# Patient Record
Sex: Female | Born: 1970 | Race: White | Hispanic: No | Marital: Married | Smoking: Never smoker
Health system: Southern US, Community
[De-identification: ages and names within clinical notes are randomized; demographics above are authoritative.]

## PROBLEM LIST (undated history)

## (undated) DIAGNOSIS — E119 Type 2 diabetes mellitus without complications: Secondary | ICD-10-CM

## (undated) DIAGNOSIS — T7840XA Allergy, unspecified, initial encounter: Secondary | ICD-10-CM

## (undated) DIAGNOSIS — E1169 Type 2 diabetes mellitus with other specified complication: Secondary | ICD-10-CM

## (undated) DIAGNOSIS — I10 Essential (primary) hypertension: Secondary | ICD-10-CM

## (undated) DIAGNOSIS — E66811 Obesity, class 1: Secondary | ICD-10-CM

## (undated) DIAGNOSIS — E669 Obesity, unspecified: Secondary | ICD-10-CM

## (undated) DIAGNOSIS — K5792 Diverticulitis of intestine, part unspecified, without perforation or abscess without bleeding: Secondary | ICD-10-CM

## (undated) HISTORY — DX: Type 2 diabetes mellitus with other specified complication: E11.69

## (undated) HISTORY — DX: Obesity, unspecified: E66.9

## (undated) HISTORY — DX: Allergy, unspecified, initial encounter: T78.40XA

## (undated) HISTORY — DX: Diverticulitis of intestine, part unspecified, without perforation or abscess without bleeding: K57.92

## (undated) HISTORY — DX: Obesity, class 1: E66.811

## (undated) HISTORY — PX: CHOLECYSTECTOMY: SHX55

## (undated) HISTORY — PX: TONSILLECTOMY: SUR1361

## (undated) HISTORY — DX: Type 2 diabetes mellitus without complications: E11.9

---

## 1997-12-20 ENCOUNTER — Other Ambulatory Visit: Admission: RE | Admit: 1997-12-20 | Discharge: 1997-12-20 | Payer: Self-pay | Admitting: Obstetrics and Gynecology

## 1998-05-03 ENCOUNTER — Ambulatory Visit (HOSPITAL_COMMUNITY): Admission: RE | Admit: 1998-05-03 | Discharge: 1998-05-03 | Payer: Self-pay | Admitting: Obstetrics and Gynecology

## 1998-06-16 ENCOUNTER — Inpatient Hospital Stay (HOSPITAL_COMMUNITY): Admission: AD | Admit: 1998-06-16 | Discharge: 1998-06-16 | Payer: Self-pay | Admitting: Obstetrics and Gynecology

## 1998-07-07 ENCOUNTER — Inpatient Hospital Stay (HOSPITAL_COMMUNITY): Admission: AD | Admit: 1998-07-07 | Discharge: 1998-07-12 | Payer: Self-pay | Admitting: Obstetrics & Gynecology

## 1998-07-13 ENCOUNTER — Encounter (HOSPITAL_COMMUNITY): Admission: RE | Admit: 1998-07-13 | Discharge: 1998-10-11 | Payer: Self-pay | Admitting: Obstetrics and Gynecology

## 1998-10-11 ENCOUNTER — Encounter (HOSPITAL_COMMUNITY): Admission: RE | Admit: 1998-10-11 | Discharge: 1999-01-09 | Payer: Self-pay | Admitting: Obstetrics and Gynecology

## 1999-12-10 ENCOUNTER — Other Ambulatory Visit: Admission: RE | Admit: 1999-12-10 | Discharge: 1999-12-10 | Payer: Self-pay | Admitting: Obstetrics and Gynecology

## 2001-01-06 ENCOUNTER — Other Ambulatory Visit: Admission: RE | Admit: 2001-01-06 | Discharge: 2001-01-06 | Payer: Self-pay | Admitting: Obstetrics and Gynecology

## 2001-09-09 ENCOUNTER — Other Ambulatory Visit: Admission: RE | Admit: 2001-09-09 | Discharge: 2001-09-09 | Payer: Self-pay | Admitting: Obstetrics and Gynecology

## 2002-03-07 ENCOUNTER — Other Ambulatory Visit: Admission: RE | Admit: 2002-03-07 | Discharge: 2002-03-07 | Payer: Self-pay | Admitting: Obstetrics and Gynecology

## 2003-03-27 ENCOUNTER — Other Ambulatory Visit: Admission: RE | Admit: 2003-03-27 | Discharge: 2003-03-27 | Payer: Self-pay | Admitting: Obstetrics and Gynecology

## 2004-06-11 ENCOUNTER — Other Ambulatory Visit: Admission: RE | Admit: 2004-06-11 | Discharge: 2004-06-11 | Payer: Self-pay | Admitting: Obstetrics and Gynecology

## 2005-06-27 ENCOUNTER — Other Ambulatory Visit: Admission: RE | Admit: 2005-06-27 | Discharge: 2005-06-27 | Payer: Self-pay | Admitting: Obstetrics and Gynecology

## 2006-01-06 ENCOUNTER — Ambulatory Visit (HOSPITAL_COMMUNITY): Admission: RE | Admit: 2006-01-06 | Discharge: 2006-01-06 | Payer: Self-pay | Admitting: Obstetrics and Gynecology

## 2008-03-29 ENCOUNTER — Emergency Department (HOSPITAL_COMMUNITY): Admission: EM | Admit: 2008-03-29 | Discharge: 2008-03-29 | Payer: Self-pay | Admitting: Emergency Medicine

## 2008-06-02 ENCOUNTER — Encounter (INDEPENDENT_AMBULATORY_CARE_PROVIDER_SITE_OTHER): Payer: Self-pay | Admitting: Surgery

## 2008-06-02 ENCOUNTER — Ambulatory Visit (HOSPITAL_COMMUNITY): Admission: RE | Admit: 2008-06-02 | Discharge: 2008-06-03 | Payer: Self-pay | Admitting: Surgery

## 2010-07-30 LAB — BASIC METABOLIC PANEL
BUN: 6 mg/dL (ref 6–23)
CO2: 27 mEq/L (ref 19–32)
Calcium: 8.7 mg/dL (ref 8.4–10.5)
Creatinine, Ser: 0.64 mg/dL (ref 0.4–1.2)
Glucose, Bld: 95 mg/dL (ref 70–99)

## 2010-08-27 NOTE — Op Note (Signed)
[name], [name]         ACCOUNT NO.:  [id number]   MEDICAL RECORD NO.:  [id number]          PATIENT TYPE:  OIB   LOCATION:  1540                         FACILITY:  [facility]   PHYSICIAN:  Thornton Park. Daphine Deutscher, MD  DATE OF BIRTH:  03-Mar-1971   DATE OF PROCEDURE:  06/02/2008  DATE OF DISCHARGE:                               OPERATIVE REPORT   PREOPERATIVE DIAGNOSIS:  Chronic cholecystitis, cholelithiasis.   POSTOPERATIVE DIAGNOSIS:  Chronic cholecystitis, cholelithiasis.   PROCEDURE:  Laparoscopic cholecystectomy with intraoperative  cholangiogram.   SURGEON:  Thornton Park. Daphine Deutscher, MD   ASSISTANT:  Currie Paris, MD   ANESTHESIA:  General endotracheal.   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  She  was taken to OR 11 on Friday, June 02, 2008, and given general  anesthesia.  The abdomen was prepped with a Techni-Care equivalent and  draped sterilely.  A longitudinal incision was made down into the  umbilicus through which a Hasson cannula was passed.  The abdomen was  insufflated and three trocars were placed in the upper abdomen.  The  gallbladder was grasped, elevated and Calot's triangle was dissected  free.  She had a lot of adhesions or stickiness down in the area of the  infundibulum to the liver but not much to the duodenum.  I dissected  this out, put a clip on the gallbladder and incised the cystic duct and  took a dynamic cholangiogram which was normal.  I triple clipped the  cystic duct and getting the clips where I wanted them, divided it and  then dissected along the gallbladder and dissected up and had a little  bleeding from the cystic artery which I dissected free and then triple  clipped and then removed the gallbladder from the gallbladder bed.  It  was a fairly intrahepatic gallbladder but no bile leaks were noted and  it was removed in toto.  The  gallbladder bed was inspected.  No bleeding  or bile leaks were noted.  It was placed in a bag and brought out  through the umbilicus.  This proved to be a little more difficult  because it was chock full of big stones.  It required me to make the  incision bigger in the umbilicus.  Following its successful extraction I  went ahead and closed the umbilical defect under laparoscopic vision  with three sutures of zero Prolene.  The wounds were injected, irrigated  and closed with 4-0 Vicryl with benzoin and Steri-Strips.  The patient  tolerated the procedure well and was taken to the recovery room in  satisfactory condition.      Thornton Park Daphine Deutscher, MD  Electronically Signed     MBM/MEDQ  D:  06/02/2008  T:  06/03/2008  Job:  [id number]

## 2011-01-17 LAB — COMPREHENSIVE METABOLIC PANEL
AST: 22 U/L (ref 0–37)
Albumin: 3.8 g/dL (ref 3.5–5.2)
Alkaline Phosphatase: 81 U/L (ref 39–117)
BUN: 7 mg/dL (ref 6–23)
Chloride: 102 mEq/L (ref 96–112)
Potassium: 3.7 mEq/L (ref 3.5–5.1)
Total Bilirubin: 0.3 mg/dL (ref 0.3–1.2)

## 2011-01-17 LAB — URINALYSIS, ROUTINE W REFLEX MICROSCOPIC
Bilirubin Urine: NEGATIVE
Glucose, UA: NEGATIVE mg/dL
Hgb urine dipstick: NEGATIVE
Ketones, ur: NEGATIVE mg/dL
pH: 6 (ref 5.0–8.0)

## 2011-01-17 LAB — CBC
HCT: 38 % (ref 36.0–46.0)
Platelets: 259 10*3/uL (ref 150–400)
RBC: 4.64 MIL/uL (ref 3.87–5.11)
WBC: 12.6 10*3/uL — ABNORMAL HIGH (ref 4.0–10.5)

## 2011-01-17 LAB — LIPASE, BLOOD: Lipase: 21 U/L (ref 11–59)

## 2011-01-17 LAB — DIFFERENTIAL
Basophils Absolute: 0 10*3/uL (ref 0.0–0.1)
Basophils Relative: 0 % (ref 0–1)
Eosinophils Relative: 2 % (ref 0–5)
Monocytes Absolute: 0.7 10*3/uL (ref 0.1–1.0)
Neutro Abs: 9 10*3/uL — ABNORMAL HIGH (ref 1.7–7.7)

## 2011-01-17 LAB — GC/CHLAMYDIA PROBE AMP, GENITAL
Chlamydia, DNA Probe: NEGATIVE
GC Probe Amp, Genital: NEGATIVE

## 2011-01-17 LAB — GC/CHLAMYDIA PROBE AMP, URINE: Chlamydia, Swab/Urine, PCR: NEGATIVE

## 2013-09-20 ENCOUNTER — Emergency Department (HOSPITAL_BASED_OUTPATIENT_CLINIC_OR_DEPARTMENT_OTHER)
Admission: EM | Admit: 2013-09-20 | Discharge: 2013-09-20 | Disposition: A | Discharge status: Home / Self Care | Payer: BC Managed Care – PPO | Attending: Emergency Medicine | Admitting: Emergency Medicine

## 2013-09-20 ENCOUNTER — Encounter (HOSPITAL_BASED_OUTPATIENT_CLINIC_OR_DEPARTMENT_OTHER): Payer: Self-pay | Admitting: Emergency Medicine

## 2013-09-20 ENCOUNTER — Emergency Department (HOSPITAL_BASED_OUTPATIENT_CLINIC_OR_DEPARTMENT_OTHER): Payer: BC Managed Care – PPO

## 2013-09-20 DIAGNOSIS — K5792 Diverticulitis of intestine, part unspecified, without perforation or abscess without bleeding: Secondary | ICD-10-CM

## 2013-09-20 DIAGNOSIS — I10 Essential (primary) hypertension: Secondary | ICD-10-CM | POA: Insufficient documentation

## 2013-09-20 DIAGNOSIS — K5732 Diverticulitis of large intestine without perforation or abscess without bleeding: Secondary | ICD-10-CM | POA: Insufficient documentation

## 2013-09-20 DIAGNOSIS — Z3202 Encounter for pregnancy test, result negative: Secondary | ICD-10-CM | POA: Insufficient documentation

## 2013-09-20 HISTORY — DX: Essential (primary) hypertension: I10

## 2013-09-20 LAB — PREGNANCY, URINE: PREG TEST UR: NEGATIVE

## 2013-09-20 LAB — CBC WITH DIFFERENTIAL/PLATELET
BASOS ABS: 0 10*3/uL (ref 0.0–0.1)
BASOS PCT: 0 % (ref 0–1)
EOS PCT: 2 % (ref 0–5)
Eosinophils Absolute: 0.3 10*3/uL (ref 0.0–0.7)
HEMATOCRIT: 36.9 % (ref 36.0–46.0)
HEMOGLOBIN: 12.4 g/dL (ref 12.0–15.0)
Lymphocytes Relative: 18 % (ref 12–46)
Lymphs Abs: 3.1 10*3/uL (ref 0.7–4.0)
MCH: 27.2 pg (ref 26.0–34.0)
MCHC: 33.6 g/dL (ref 30.0–36.0)
MCV: 80.9 fL (ref 78.0–100.0)
MONO ABS: 2 10*3/uL — AB (ref 0.1–1.0)
MONOS PCT: 11 % (ref 3–12)
NEUTROS ABS: 12.1 10*3/uL — AB (ref 1.7–7.7)
Neutrophils Relative %: 69 % (ref 43–77)
Platelets: 277 10*3/uL (ref 150–400)
RBC: 4.56 MIL/uL (ref 3.87–5.11)
RDW: 14.4 % (ref 11.5–15.5)
WBC: 17.6 10*3/uL — ABNORMAL HIGH (ref 4.0–10.5)

## 2013-09-20 LAB — BASIC METABOLIC PANEL
BUN: 7 mg/dL (ref 6–23)
CALCIUM: 9.2 mg/dL (ref 8.4–10.5)
CHLORIDE: 98 meq/L (ref 96–112)
CO2: 28 meq/L (ref 19–32)
CREATININE: 0.7 mg/dL (ref 0.50–1.10)
GFR calc non Af Amer: >90 mL/min (ref >90)
Glucose, Bld: 124 mg/dL — ABNORMAL HIGH (ref 70–99)
Potassium: 3.1 mEq/L — ABNORMAL LOW (ref 3.7–5.3)
Sodium: 140 mEq/L (ref 137–147)

## 2013-09-20 LAB — URINE MICROSCOPIC-ADD ON

## 2013-09-20 LAB — URINALYSIS, ROUTINE W REFLEX MICROSCOPIC
BILIRUBIN URINE: NEGATIVE
GLUCOSE, UA: NEGATIVE mg/dL
KETONES UR: NEGATIVE mg/dL
Leukocytes, UA: NEGATIVE
Nitrite: NEGATIVE
PROTEIN: NEGATIVE mg/dL
Specific Gravity, Urine: 1.005 (ref 1.005–1.030)
UROBILINOGEN UA: 0.2 mg/dL (ref 0.0–1.0)
pH: 6.5 (ref 5.0–8.0)

## 2013-09-20 MED ORDER — SODIUM CHLORIDE 0.9 % IV BOLUS (SEPSIS)
1000.0000 mL | Freq: Once | INTRAVENOUS | Status: AC
Start: 2013-09-20 — End: 2013-09-20
  Administered 2013-09-20: 1000 mL via INTRAVENOUS

## 2013-09-20 MED ORDER — METRONIDAZOLE 500 MG PO TABS: 500.0000 mg | ORAL_TABLET | Freq: Three times a day (TID) | ORAL | Status: DC

## 2013-09-20 MED ORDER — CIPROFLOXACIN HCL 500 MG PO TABS: 500.0000 mg | ORAL_TABLET | Freq: Two times a day (BID) | ORAL | Status: DC

## 2013-09-20 MED ORDER — HYDROCODONE-ACETAMINOPHEN 5-325 MG PO TABS: 1.0000 | ORAL_TABLET | ORAL | Status: DC | PRN

## 2013-09-20 NOTE — ED Notes (Signed)
Lower abdominal pain x 3 days. Got worse last night. She was seen at [facility] today and told to come here if the pain got worse. They did not find a cause for the pain. Her pregnancy test was negative.

## 2013-09-20 NOTE — ED Provider Notes (Signed)
CSN: [id number]     Arrival date & time 09/20/13  2136 History  This chart was scribed for Veryl Speak, MD by Delphia Grates, ED Scribe. This patient was seen in room MH07/MH07 and the patient's care was started at 9:58 PM.    Chief Complaint  Patient presents with  . Abdominal Pain     The history is provided by the patient. No language interpreter was used.    HPI Comments: Rosemarie Galvis is a 43 y.o. female who presents to the Emergency Department complaining of gradually worsening, lower abdominal pain onset 3 days ago. Patient states she was seen at [facility] today and was told to come here if her pain gets worse. Patient states they were unable to perform an ultrasound. There is associated fever and hematuria. Patient states she has history of cysts, but is unsure if her symptoms are related. She denies nausea, emesis, diarrhea, vaginal bleeding or discharge. Patient has past surgical history of cholecystectomy and cesarean section. She denies history of diverticulitis and diverticulosis. LMP was yesterday and she reports it as being shorter than normal.   Past Medical History  Diagnosis Date  . Hypertension    Past Surgical History  Procedure Laterality Date  . Cholecystectomy    . Cesarean section    . Tonsillectomy     No family history on file. History  Substance Use Topics  . Smoking status: Never Smoker   . Smokeless tobacco: Not on file  . Alcohol Use: No   OB History   Grav Para Term Preterm Abortions TAB SAB Ect Mult Living                 Review of Systems A complete 10 system review of systems was obtained and all systems are negative except as noted in the HPI and PMH.     Allergies  Erythromycin  Home Medications   Prior to Admission medications   Medication Sig Start Date End Date Taking? Authorizing Provider  Amoxicillin-Pot Clavulanate (AUGMENTIN PO) Take by mouth.   Yes Historical Provider, MD  HYDROCHLOROTHIAZIDE PO Take by mouth.    Yes Historical Provider, MD   Triage Vitals: BP 155/95  Pulse 110  Temp(Src) 99 F (37.2 C) (Oral)  Resp 20  Ht 5\' 4"  (1.626 m)  Wt 198 lb (89.812 kg)  BMI 33.97 kg/m2  SpO2 98%  LMP 09/17/2013  Physical Exam  Nursing note and vitals reviewed. Constitutional: She is oriented to person, place, and time. She appears well-developed and well-nourished.  HENT:  Head: Normocephalic and atraumatic.  Cardiovascular: Normal rate, regular rhythm and normal heart sounds.   Pulmonary/Chest: Effort normal and breath sounds normal.  Abdominal: There is no rebound and no guarding.  TTP in LLQ.  Neurological: She is alert and oriented to person, place, and time.  Skin: Skin is warm and dry.  Psychiatric: She has a normal mood and affect. Her behavior is normal.    ED Course  Procedures (including critical care time)  DIAGNOSTIC STUDIES: Oxygen Saturation is 98% on room air, normal by my interpretation.    COORDINATION OF CARE: At 2207 Discussed treatment plan with patient which includes CT scan and CBC. Patient agrees.   Labs Review Labs Reviewed  URINALYSIS, ROUTINE W REFLEX MICROSCOPIC  PREGNANCY, URINE    Imaging Review No results found.   EKG Interpretation None      MDM   Final diagnoses:  None    Patient is a 43 year old female who presents  with complaints of left lower abdominal pain for the past 3 days. He was seen in urgent care and told to come here if her pain got worse. She is tender to palpation in the left lower quadrant. Workup reveals an elevated white count and CT scan shows sigmoid diverticulitis. She will be treated with Cipro and Flagyl and pain medication and will be discharged to home. She understands to return if she develops worsening pain, bloody stools, high fever, or any other new and concerning symptom. She will also be advised to followup with her primary Dr. who may wish to refer her to a gastroenterologist to discuss a colonoscopy.  I  personally performed the services described in this documentation, which was scribed in my presence. The recorded information has been reviewed and is accurate.      Veryl Speak, MD 09/20/13 2308

## 2013-09-20 NOTE — Discharge Instructions (Signed)
Cipro and Flagyl as prescribed.  Hydrocodone as needed for pain.  Return to the emergency department if you develop worsening pain, bloody stool, high fever, or other new and concerning symptoms.  Follow up with your primary Dr. to discuss the possible need for colonoscopy.   Diverticulitis A diverticulum is a small pouch or sac on the colon. Diverticulosis is the presence of these diverticula on the colon. Diverticulitis is the irritation (inflammation) or infection of diverticula. CAUSES  The colon and its diverticula contain bacteria. If food particles block the tiny opening to a diverticulum, the bacteria inside can grow and cause an increase in pressure. This leads to infection and inflammation and is called diverticulitis. SYMPTOMS   Abdominal pain and tenderness. Usually, the pain is located on the left side of your abdomen. However, it could be located elsewhere.  Fever.  Bloating.  Feeling sick to your stomach (nausea).  Throwing up (vomiting).  Abnormal stools. DIAGNOSIS  Your caregiver will take a history and perform a physical exam. Since many things can cause abdominal pain, other tests may be necessary. Tests may include:  Blood tests.  Urine tests.  X-ray of the abdomen.  CT scan of the abdomen. Sometimes, surgery is needed to determine if diverticulitis or other conditions are causing your symptoms. TREATMENT  Most of the time, you can be treated without surgery. Treatment includes:  Resting the bowels by only having liquids for a few days. As you improve, you will need to eat a low-fiber diet.  Intravenous (IV) fluids if you are losing body fluids (dehydrated).  Antibiotic medicines that treat infections may be given.  Pain and nausea medicine, if needed.  Surgery if the inflamed diverticulum has burst. HOME CARE INSTRUCTIONS   Try a clear liquid diet (broth, tea, or water for as long as directed by your caregiver). You may then gradually begin a  low-fiber diet as tolerated.  A low-fiber diet is a diet with less than 10 grams of fiber. Choose the foods below to reduce fiber in the diet:  White breads, cereals, rice, and pasta.  Cooked fruits and vegetables or soft fresh fruits and vegetables without the skin.  Ground or well-cooked tender beef, ham, veal, lamb, pork, or poultry.  Eggs and seafood.  After your diverticulitis symptoms have improved, your caregiver may put you on a high-fiber diet. A high-fiber diet includes 14 grams of fiber for every 1000 calories consumed. For a standard 2000 calorie diet, you would need 28 grams of fiber. Follow these diet guidelines to help you increase the fiber in your diet. It is important to slowly increase the amount fiber in your diet to avoid gas, constipation, and bloating.  Choose whole-grain breads, cereals, pasta, and brown rice.  Choose fresh fruits and vegetables with the skin on. Do not overcook vegetables because the more vegetables are cooked, the more fiber is lost.  Choose more nuts, seeds, legumes, dried peas, beans, and lentils.  Look for food products that have greater than 3 grams of fiber per serving on the Nutrition Facts label.  Take all medicine as directed by your caregiver.  If your caregiver has given you a follow-up appointment, it is very important that you go. Not going could result in lasting (chronic) or permanent injury, pain, and disability. If there is any problem keeping the appointment, call to reschedule. SEEK MEDICAL CARE IF:   Your pain does not improve.  You have a hard time advancing your diet beyond clear liquids.  Your bowel movements do not return to normal. SEEK IMMEDIATE MEDICAL CARE IF:   Your pain becomes worse.  You have an oral temperature above 102 F (38.9 C), not controlled by medicine.  You have repeated vomiting.  You have bloody or black, tarry stools.  Symptoms that brought you to your caregiver become worse or are not  getting better. MAKE SURE YOU:   Understand these instructions.  Will watch your condition.  Will get help right away if you are not doing well or get worse. Document Released: 01/08/2005 Document Revised: 06/23/2011 Document Reviewed: 05/06/2010 [facility] Patient Information 2014 [place].

## 2013-09-21 ENCOUNTER — Other Ambulatory Visit: Payer: Self-pay | Admitting: General Practice

## 2013-09-21 DIAGNOSIS — R1032 Left lower quadrant pain: Secondary | ICD-10-CM

## 2013-10-06 ENCOUNTER — Encounter: Payer: Self-pay | Admitting: Internal Medicine

## 2013-12-13 ENCOUNTER — Encounter: Payer: Self-pay | Admitting: Internal Medicine

## 2013-12-13 ENCOUNTER — Ambulatory Visit (INDEPENDENT_AMBULATORY_CARE_PROVIDER_SITE_OTHER): Payer: BC Managed Care – PPO | Admitting: Internal Medicine

## 2013-12-13 VITALS — BP 126/80 | HR 86 | Ht 64.0 in | Wt 197.4 lb

## 2013-12-13 DIAGNOSIS — R1032 Left lower quadrant pain: Secondary | ICD-10-CM

## 2013-12-13 DIAGNOSIS — R933 Abnormal findings on diagnostic imaging of other parts of digestive tract: Secondary | ICD-10-CM

## 2013-12-13 DIAGNOSIS — K5732 Diverticulitis of large intestine without perforation or abscess without bleeding: Secondary | ICD-10-CM

## 2013-12-13 MED ORDER — MOVIPREP 100 G PO SOLR: 1.0000 | Freq: Once | ORAL | Status: DC

## 2013-12-13 NOTE — Progress Notes (Signed)
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 She is status post cholecystectomy. The patient reports being in her usual state of good health until early June 2015 when she developed left lower quadrant pain. She thought that this was related to her menstrual period. However, the discomfort persisted and worsened and was eventually associated with fever. She presented to the emergency room 09/20/2013. Blood work was remarkable for leukocytosis with white blood cell count 17.6. Contrast-enhanced CT scan of the abdomen and pelvis revealed colonic diverticulosis with superimposed 7 cm segment of sigmoid diverticulitis manifested by wall thickening with pericolonic inflammatory changes. No abscess. She was treated with a 10 day course of ciprofloxacin and metronidazole. Within 1 week her symptoms resolved. She has been well since. Patient denies family or personal history of gastrointestinal malignancy or inflammatory bowel disease. GI review of systems is currently negative  REVIEW OF SYSTEMS:  All non-GI ROS negative upon review  Past Medical History  Diagnosis Date  . Hypertension   . Diverticulitis     Past Surgical History  Procedure Laterality Date  . Cholecystectomy    . Cesarean section    . Tonsillectomy      Social History Karinda Cabriales  reports that she has never smoked. She has never used smokeless tobacco. She reports that she does not drink alcohol or use illicit drugs.  family history includes Alzheimer's disease in her father; Brain cancer in her maternal grandmother; Hyperlipidemia in her mother; Hypertension in her father and mother; Uterine cancer in her maternal grandmother. There is no history of Colon cancer or Colon polyps.  Allergies  Allergen Reactions  . Erythromycin Anaphylaxis       PHYSICAL EXAMINATION: Vital signs: BP  126/80  Pulse 86  Ht 5\' 4"  (1.626 m)  Wt 197 lb 6 oz (89.529 kg)  BMI 33.86 kg/m2  LMP 12/05/2013  Constitutional: Pleasant, generally well-appearing, no acute distress Psychiatric: alert and oriented x3, cooperative Eyes: extraocular movements intact, anicteric, conjunctiva pink Mouth: oral pharynx moist, no lesions Neck: supple no lymphadenopathy Cardiovascular: heart regular rate and rhythm, no murmur Lungs: clear to auscultation bilaterally Abdomen: soft, obese nontender, nondistended, no obvious ascites, no peritoneal signs, normal bowel sounds, no organomegaly Rectal: Deferred until colonoscopy Extremities: no lower extremity edema bilaterally Skin: no lesions on visible extremities Neuro: No focal deficits.   ASSESSMENT:  #1. Recent problems with acute left lower quadrant pain, fever, and abnormal CT scan all suggesting acute diverticulitis. Improvement after response to appropriate antibiotics. We discussed diverticulitis. We also discussed the role of colonoscopy in patients with suspected new-onset diverticulitis. Though colonoscopy after resolution of acute diverticulitis, to rule out other entities masquerading as diverticulitis, had been standard of care, this is more controversial. We discussed the pros and cons of colonoscopy. As I discussed with her, I really do feel that this is acute diverticulitis that has resolved, but colonoscopy is not unreasonable. She is agreeable   PLAN:  #1. Colonoscopy. The nature of the procedure, as well as the risks, benefits, and alternatives were carefully and thoroughly reviewed with the patient. Ample time for discussion and questions allowed. The patient understood, was satisfied, and agreed to proceed. Movi prep prescribed. Patient instructed on its use #2. Educational information on diverticulosis provided

## 2013-12-13 NOTE — Patient Instructions (Signed)

## 2014-02-09 ENCOUNTER — Telehealth: Payer: Self-pay | Admitting: Internal Medicine

## 2014-02-09 NOTE — Telephone Encounter (Signed)
Spoke with patient, rescheduled colon for 04/03/2014.  Will mail patient new instructions reflecting new date

## 2014-02-13 ENCOUNTER — Encounter: Payer: BC Managed Care – PPO | Admitting: Internal Medicine

## 2014-04-03 ENCOUNTER — Ambulatory Visit (AMBULATORY_SURGERY_CENTER): Payer: BC Managed Care – PPO | Admitting: Internal Medicine

## 2014-04-03 ENCOUNTER — Encounter: Payer: Self-pay | Admitting: Internal Medicine

## 2014-04-03 VITALS — BP 116/86 | HR 66 | Temp 99.4°F | Resp 18 | Ht 64.0 in | Wt 197.0 lb

## 2014-04-03 DIAGNOSIS — K5730 Diverticulosis of large intestine without perforation or abscess without bleeding: Secondary | ICD-10-CM

## 2014-04-03 DIAGNOSIS — R933 Abnormal findings on diagnostic imaging of other parts of digestive tract: Secondary | ICD-10-CM

## 2014-04-03 DIAGNOSIS — D123 Benign neoplasm of transverse colon: Secondary | ICD-10-CM

## 2014-04-03 DIAGNOSIS — K635 Polyp of colon: Secondary | ICD-10-CM

## 2014-04-03 DIAGNOSIS — R1032 Left lower quadrant pain: Secondary | ICD-10-CM

## 2014-04-03 DIAGNOSIS — R198 Other specified symptoms and signs involving the digestive system and abdomen: Secondary | ICD-10-CM

## 2014-04-03 MED ORDER — SODIUM CHLORIDE 0.9 % IV SOLN: 500.0000 mL | INTRAVENOUS | Status: DC

## 2014-04-03 NOTE — Patient Instructions (Signed)
Discharge instructions given. Handouts on polyps and diverticulosis. Resume previous medications. YOU HAD AN ENDOSCOPIC PROCEDURE TODAY AT THE [facility]: Refer to the procedure report that was given to you for any specific questions about what was found during the examination.  If the procedure report does not answer your questions, please call your gastroenterologist to clarify.  If you requested that your care partner not be given the details of your procedure findings, then the procedure report has been included in a sealed envelope for you to review at your convenience later.  YOU SHOULD EXPECT: Some feelings of bloating in the abdomen. Passage of more gas than usual.  Walking can help get rid of the air that was put into your GI tract during the procedure and reduce the bloating. If you had a lower endoscopy (such as a colonoscopy or flexible sigmoidoscopy) you may notice spotting of blood in your stool or on the toilet paper. If you underwent a bowel prep for your procedure, then you may not have a normal bowel movement for a few days.  DIET: Your first meal following the procedure should be a light meal and then it is ok to progress to your normal diet.  A half-sandwich or bowl of soup is an example of a good first meal.  Heavy or fried foods are harder to digest and may make you feel nauseous or bloated.  Likewise meals heavy in dairy and vegetables can cause extra gas to form and this can also increase the bloating.  Drink plenty of fluids but you should avoid alcoholic beverages for 24 hours.  ACTIVITY: Your care partner should take you home directly after the procedure.  You should plan to take it easy, moving slowly for the rest of the day.  You can resume normal activity the day after the procedure however you should NOT DRIVE or use heavy machinery for 24 hours (because of the sedation medicines used during the test).    SYMPTOMS TO REPORT IMMEDIATELY: A gastroenterologist  can be reached at any hour.  During normal business hours, 8:30 AM to 5:00 PM Monday through Friday, call [phone].  After hours and on weekends, please call the GI answering service at [phone] who will take a message and have the physician on call contact you.   Following lower endoscopy (colonoscopy or flexible sigmoidoscopy):  Excessive amounts of blood in the stool  Significant tenderness or worsening of abdominal pains  Swelling of the abdomen that is new, acute  Fever of 100F or higher  FOLLOW UP: If any biopsies were taken you will be contacted by phone or by letter within the next 1-3 weeks.  Call your gastroenterologist if you have not heard about the biopsies in 3 weeks.  Our staff will call the home number listed on your records the next business day following your procedure to check on you and address any questions or concerns that you may have at that time regarding the information given to you following your procedure. This is a courtesy call and so if there is no answer at the home number and we have not heard from you through the emergency physician on call, we will assume that you have returned to your regular daily activities without incident.  SIGNATURES/CONFIDENTIALITY: You and/or your care partner have signed paperwork which will be entered into your electronic medical record.  These signatures attest to the fact that that the information above on your After Visit Summary has been reviewed   and is understood.  Full responsibility of the confidentiality of this discharge information lies with you and/or your care-partner. 

## 2014-04-03 NOTE — Op Note (Signed)
[facility]  [address] [place], [id number]   COLONOSCOPY PROCEDURE REPORT  PATIENT: Sarah, Romero  MR#: [id number] BIRTHDATE: [birth year] , 66  yrs. old GENDER: female ENDOSCOPIST: Eustace Quail, MD REFERRED EM:LJQG Sabra Heck, M.D. PROCEDURE DATE:  04/03/2014 PROCEDURE:   Colonoscopy with snare polypectomy x 3 First Screening Colonoscopy - Avg.  risk and is 50 yrs.  old or older - No.  Prior Negative Screening - Now for repeat screening. N/A  History of Adenoma - Now for follow-up colonoscopy & has been > or = to 3 yrs.  N/A  Polyps Removed Today? Yes. ASA CLASS:   Class II INDICATIONS:an abnormal CT and abdominal pain in the lower right quadrant. Felt to have had diverticulitis in June.. Now asymptomatic MEDICATIONS: Monitored anesthesia care and Propofol 270 mg IV  DESCRIPTION OF PROCEDURE:   After the risks benefits and alternatives of the procedure were thoroughly explained, informed consent was obtained.  The digital rectal exam revealed no abnormalities of the rectum.   The LB [id number]  endoscope was introduced through the anus and advanced to the cecum, which was identified by both the appendix and ileocecal valve. No adverse events experienced.   The quality of the prep was excellent, using MoviPrep  The instrument was then slowly withdrawn as the colon was fully examined.  COLON FINDINGS: Three sessile polyps ranging between 5-63mm in size were found in the transverse colon.  A polypectomy was performed with a cold snare.  The resection was complete, the polyp tissue was completely retrieved and sent to histology.   There was moderate diverticulosis noted in the left colon.   The examination was otherwise normal.  Retroflexed views revealed no abnormalities. The time to cecum=1 minutes 58 seconds.  Withdrawal time=10 minutes 03 seconds.  The scope was withdrawn and the procedure completed. COMPLICATIONS: There were no immediate  complications.  ENDOSCOPIC IMPRESSION: 1.   Three polyps were found in the transverse colon; polypectomy was performed with a cold snare 2.   Moderate diverticulosis was noted in the left colon 3.   The examination was otherwise normal  RECOMMENDATIONS: 1. Repeat colonoscopy in 5 years if polyp adenomatous; otherwise 10 years  eSigned:  Eustace Quail, MD 04/03/2014 2:28 PM   cc: Kathyrn Lass, MD and The Patient

## 2014-04-03 NOTE — Progress Notes (Signed)
Called to room to assist during endoscopic procedure.  Patient ID and intended procedure confirmed with present staff. Received instructions for my participation in the procedure from the performing physician.  

## 2014-04-03 NOTE — Progress Notes (Signed)
Report to PACU, RN, vss, BBS= Clear.  

## 2014-04-04 ENCOUNTER — Telehealth: Payer: Self-pay

## 2014-04-04 NOTE — Telephone Encounter (Signed)
Left message on answering machine. 

## 2014-04-11 ENCOUNTER — Encounter: Payer: Self-pay | Admitting: Internal Medicine

## 2014-08-15 DIAGNOSIS — Z9089 Acquired absence of other organs: Secondary | ICD-10-CM | POA: Insufficient documentation

## 2014-08-15 DIAGNOSIS — Z7951 Long term (current) use of inhaled steroids: Secondary | ICD-10-CM | POA: Diagnosis not present

## 2014-08-15 DIAGNOSIS — Z8719 Personal history of other diseases of the digestive system: Secondary | ICD-10-CM | POA: Diagnosis not present

## 2014-08-15 DIAGNOSIS — R509 Fever, unspecified: Secondary | ICD-10-CM | POA: Diagnosis not present

## 2014-08-15 DIAGNOSIS — R51 Headache: Secondary | ICD-10-CM | POA: Diagnosis present

## 2014-08-15 DIAGNOSIS — I10 Essential (primary) hypertension: Secondary | ICD-10-CM | POA: Insufficient documentation

## 2014-08-16 ENCOUNTER — Emergency Department (HOSPITAL_BASED_OUTPATIENT_CLINIC_OR_DEPARTMENT_OTHER)
Admission: EM | Admit: 2014-08-16 | Discharge: 2014-08-16 | Disposition: A | Discharge status: Home / Self Care | Payer: BC Managed Care – PPO | Attending: Emergency Medicine | Admitting: Emergency Medicine

## 2014-08-16 ENCOUNTER — Encounter (HOSPITAL_BASED_OUTPATIENT_CLINIC_OR_DEPARTMENT_OTHER): Payer: Self-pay | Admitting: *Deleted

## 2014-08-16 DIAGNOSIS — R51 Headache: Secondary | ICD-10-CM

## 2014-08-16 DIAGNOSIS — R519 Headache, unspecified: Secondary | ICD-10-CM

## 2014-08-16 MED ORDER — SALINE SPRAY 0.65 % NA SOLN
1.0000 | Freq: Once | NASAL | Status: AC
  Administered 2014-08-16: 1 via NASAL
  Filled 2014-08-16: qty 44

## 2014-08-16 MED ORDER — FLUTICASONE PROPIONATE 50 MCG/ACT NA SUSP
1.0000 | Freq: Every day | NASAL | Status: DC
  Filled 2014-08-16: qty 16

## 2014-08-16 MED ORDER — SODIUM CHLORIDE 0.9 % IV BOLUS (SEPSIS)
1000.0000 mL | Freq: Once | INTRAVENOUS | Status: AC
  Administered 2014-08-16: 1000 mL via INTRAVENOUS

## 2014-08-16 MED ORDER — FLUTICASONE PROPIONATE 50 MCG/ACT NA SUSP: 1.0000 | Freq: Every day | NASAL | Status: AC

## 2014-08-16 MED ORDER — PROCHLORPERAZINE EDISYLATE 5 MG/ML IJ SOLN
10.0000 mg | Freq: Four times a day (QID) | INTRAMUSCULAR | Status: DC | PRN
  Administered 2014-08-16: 10 mg via INTRAVENOUS
  Filled 2014-08-16: qty 2

## 2014-08-16 MED ORDER — KETOROLAC TROMETHAMINE 30 MG/ML IJ SOLN
30.0000 mg | Freq: Once | INTRAMUSCULAR | Status: AC
  Administered 2014-08-16: 30 mg via INTRAVENOUS
  Filled 2014-08-16: qty 1

## 2014-08-16 MED ORDER — DIPHENHYDRAMINE HCL 50 MG/ML IJ SOLN
25.0000 mg | Freq: Once | INTRAMUSCULAR | Status: AC
  Administered 2014-08-16: 25 mg via INTRAVENOUS
  Filled 2014-08-16: qty 1

## 2014-08-16 NOTE — ED Provider Notes (Signed)
CSN: [id number]     Arrival date & time 08/15/14  2358 History   First MD Initiated Contact with Patient 08/16/14 0006     Chief Complaint  Patient presents with  . Migraine     (Consider location/radiation/quality/duration/timing/severity/associated sxs/prior Treatment) HPI  This is a 44 year old female with a history of hypertension who presents with headache. Patient reports headache for the last 3 days. States she has had a migraine only twice before in her life. Describes the headache as "my head is about to explode." She states that it is behind her eyes and her frontal sinuses. She took Tylenol PM prior to arrival without any relief. Patient reports approximate 2 weeks ago she had sinus congestion, ear pain, and vertiginous symptoms. She started taking Augmentin on Sunday for presumed sinus infection. Reports low-grade fevers of 100. Denies any neck pain or stiffness. Denies any worsening headache of her life. Current pain is 7 out of 10.  Past Medical History  Diagnosis Date  . Hypertension   . Diverticulitis    Past Surgical History  Procedure Laterality Date  . Cholecystectomy    . Cesarean section    . Tonsillectomy     Family History  Problem Relation Age of Onset  . Colon cancer Neg Hx   . Colon polyps Neg Hx   . Hypertension Mother   . Hypertension Father   . Hyperlipidemia Mother   . Alzheimer's disease Father     Early onset  . Uterine cancer Maternal Grandmother     Great  . Brain cancer Maternal Grandmother    History  Substance Use Topics  . Smoking status: Never Smoker   . Smokeless tobacco: Never Used  . Alcohol Use: No     Comment: Occassionally   OB History    No data available     Review of Systems  Constitutional: Positive for fever.  HENT: Positive for congestion and sinus pressure.   Eyes: Positive for photophobia.  Respiratory: Negative for cough, chest tightness and shortness of breath.   Cardiovascular: Negative for chest pain.   Gastrointestinal: Negative for nausea, vomiting and abdominal pain.  Genitourinary: Negative for dysuria.  Neurological: Positive for headaches. Negative for dizziness, weakness and numbness.  Psychiatric/Behavioral: Negative for confusion.  All other systems reviewed and are negative.     Allergies  Erythromycin  Home Medications   Prior to Admission medications   Medication Sig Start Date End Date Taking? Authorizing Provider  fluticasone (FLONASE) 50 MCG/ACT nasal spray Place 1 spray into both nostrils daily. 08/16/14   Merryl Hacker, MD  HYDROCHLOROTHIAZIDE PO Take by mouth.    Historical Provider, MD   BP 155/101 mmHg  Pulse 106  Temp(Src) 99.2 F (37.3 C) (Oral)  Resp 20  Ht 5\' 4"  (1.626 m)  Wt 190 lb (86.183 kg)  BMI 32.60 kg/m2  SpO2 98%  LMP 08/10/2014 Physical Exam  Constitutional: She is oriented to person, place, and time. She appears well-developed and well-nourished. No distress.  Uncomfortable appearing, nontoxic  HENT:  Head: Normocephalic and atraumatic.  Right Ear: External ear normal.  Left Ear: External ear normal.  Mouth/Throat: Oropharynx is clear and moist.  Tenderness to palpation over the frontal and maxillary sinuses  Eyes: Pupils are equal, round, and reactive to light.  Neck: Neck supple.  No meningismus  Cardiovascular: Normal rate, regular rhythm and normal heart sounds.   Pulmonary/Chest: Effort normal and breath sounds normal. No respiratory distress. She has no wheezes.  Abdominal: Soft.  There is no tenderness.  Neurological: She is alert and oriented to person, place, and time.  Skin: Skin is warm and dry.  Psychiatric: She has a normal mood and affect.  Nursing note and vitals reviewed.   ED Course  Procedures (including critical care time) Labs Review Labs Reviewed - No data to display  Imaging Review No results found.   EKG Interpretation None      MDM   Final diagnoses:  Sinus headache    Patient presents  with headache. Headache is in the setting of sinus congestion and URI symptoms. Currently on Augmentin. Tenderness palpation over the frontal and maxillary sinuses. Suspect sinus headache. Given duration of headache and pain, patient given migraine cocktail. Also discussed with patient supportive care at home to add nasal saline and Flonase. Patient stated understanding. Follow migraine cocktail, patient resting comfortably. Low suspicion at this time for subarachnoid hemorrhage or meningitis. Will discharge home and have patient follow-up with primary physician.  After history, exam, and medical workup I feel the patient has been appropriately medically screened and is safe for discharge home. Pertinent diagnoses were discussed with the patient. Patient was given return precautions.   Merryl Hacker, MD 08/16/14 [phone]

## 2014-08-16 NOTE — ED Notes (Signed)
Pt c/o migraine x 3 days , URi symptoms x 3 weeks

## 2014-08-16 NOTE — Discharge Instructions (Signed)
Sinus Headache °A sinus headache is when your sinuses become clogged or swollen. Sinus headaches can range from mild to severe.  °CAUSES °A sinus headache can have different causes, such as: °· Colds. °· Sinus infections. °· Allergies. °SYMPTOMS  °Symptoms of a sinus headache may vary and can include: °· Headache. °· Pain or pressure in the face. °· Congested or runny nose. °· Fever. °· Inability to smell. °· Pain in upper teeth. °Weather changes can make symptoms worse. °TREATMENT  °The treatment of a sinus headache depends on the cause. °· Sinus pain caused by a sinus infection may be treated with antibiotic medicine. °· Sinus pain caused by allergies may be helped by allergy medicines (antihistamines) and medicated nasal sprays. °· Sinus pain caused by congestion may be helped by flushing the nose and sinuses with saline solution. °HOME CARE INSTRUCTIONS  °· If antibiotics are prescribed, take them as directed. Finish them even if you start to feel better. °· Only take over-the-counter or prescription medicines for pain, discomfort, or fever as directed by your caregiver. °· If you have congestion, use a nasal spray to help reduce pressure. °SEEK IMMEDIATE MEDICAL CARE IF: °· You have a fever. °· You have headaches more than once a week. °· You have sensitivity to light or sound. °· You have repeated nausea and vomiting. °· You have vision problems. °· You have sudden, severe pain in your face or head. °· You have a seizure. °· You are confused. °· Your sinus headaches do not get better after treatment. Many people think they have a sinus headache when they actually have migraines or tension headaches. °MAKE SURE YOU:  °· Understand these instructions. °· Will watch your condition. °· Will get help right away if you are not doing well or get worse. °Document Released: 05/08/2004 Document Revised: 06/23/2011 Document Reviewed: 06/29/2010 °[employer]® Patient Information ©2015 [employer]. This information is not  intended to replace advice given to you by your health care provider. Make sure you discuss any questions you have with your health care provider. ° °

## 2014-10-17 ENCOUNTER — Other Ambulatory Visit: Payer: Self-pay | Admitting: Obstetrics and Gynecology

## 2014-10-18 LAB — CYTOLOGY - PAP

## 2015-11-20 ENCOUNTER — Other Ambulatory Visit: Payer: Self-pay | Admitting: Obstetrics and Gynecology

## 2015-11-20 DIAGNOSIS — R928 Other abnormal and inconclusive findings on diagnostic imaging of breast: Secondary | ICD-10-CM

## 2015-12-04 ENCOUNTER — Ambulatory Visit
Admission: RE | Admit: 2015-12-04 | Discharge: 2015-12-04 | Disposition: A | Discharge status: Home / Self Care | Payer: BC Managed Care – PPO | Source: Ambulatory Visit | Attending: Obstetrics and Gynecology | Admitting: Obstetrics and Gynecology

## 2015-12-04 DIAGNOSIS — R928 Other abnormal and inconclusive findings on diagnostic imaging of breast: Secondary | ICD-10-CM

## 2016-11-01 IMAGING — MG 2D DIGITAL DIAGNOSTIC UNILATERAL LEFT MAMMOGRAM WITH CAD AND ADJ
6 series · 6 of 14 positions shown · non-contrast
Comparison: Previous exam(s).

CLINICAL DATA: Recall from screening mammography.

EXAM:
2D DIGITAL DIAGNOSTIC UNILATERAL LEFT MAMMOGRAM WITH CAD AND ADJUNCT
TOMO

[L MLO]
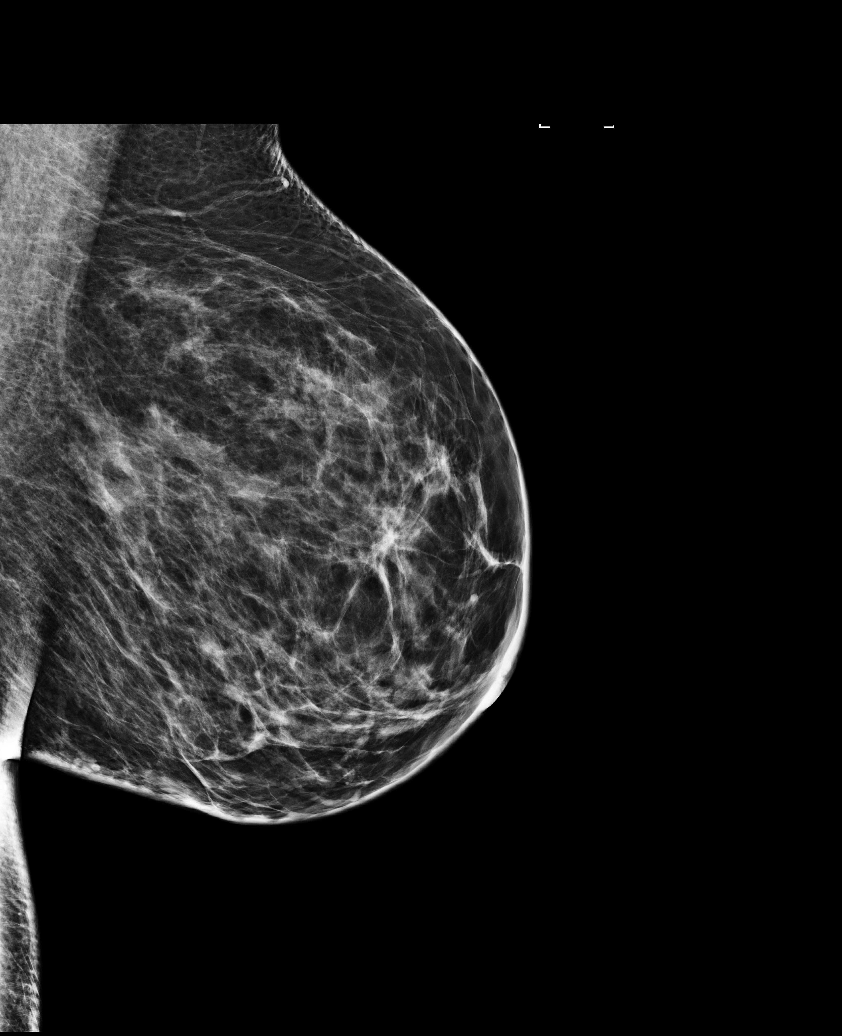

[L CC]
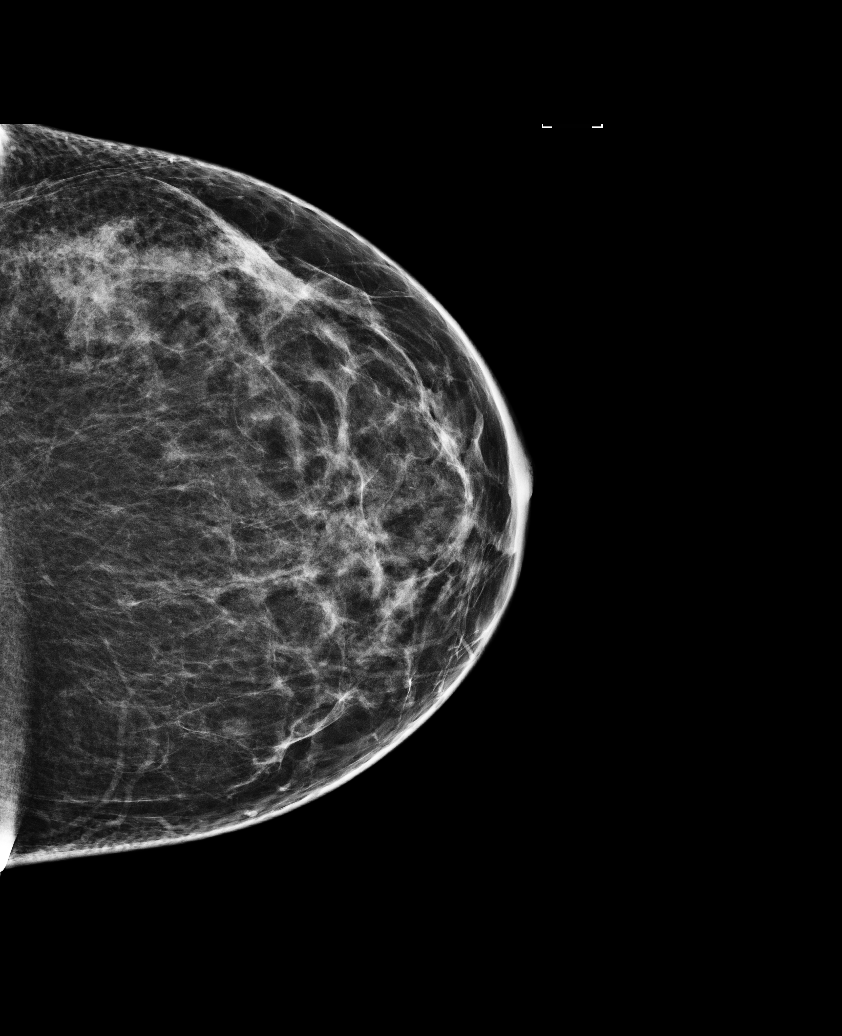

[L CC synth-2D]
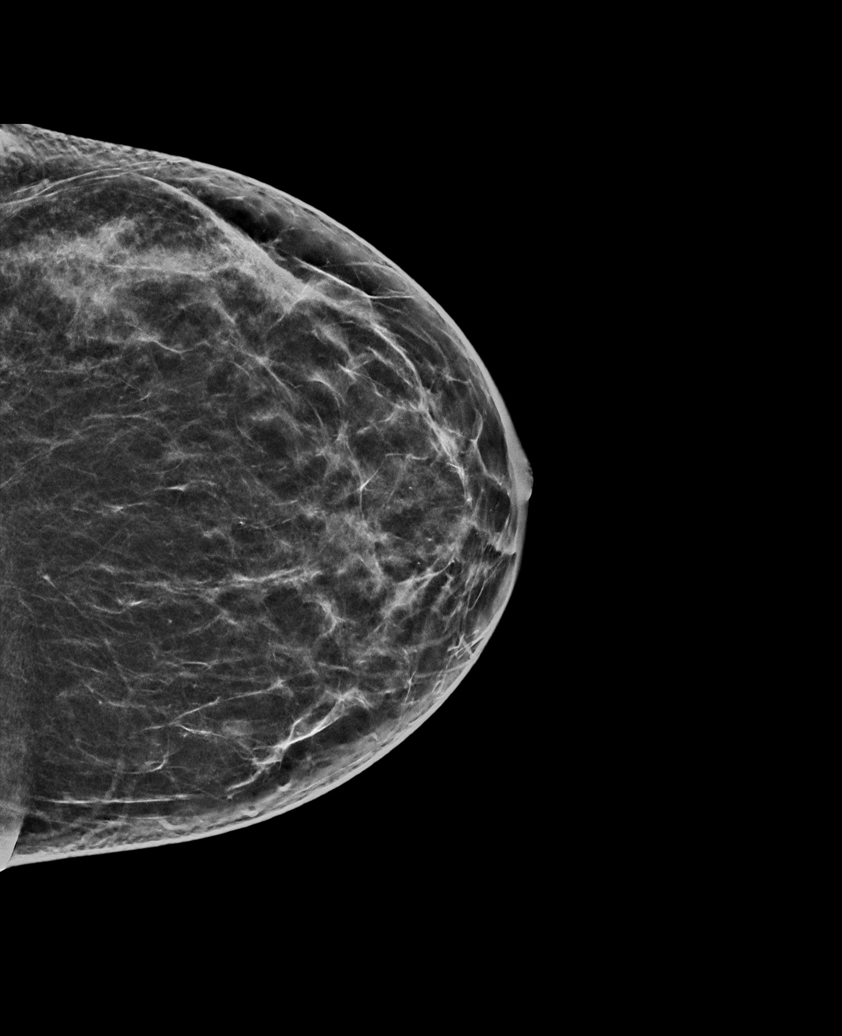

[L MLO synth-2D]
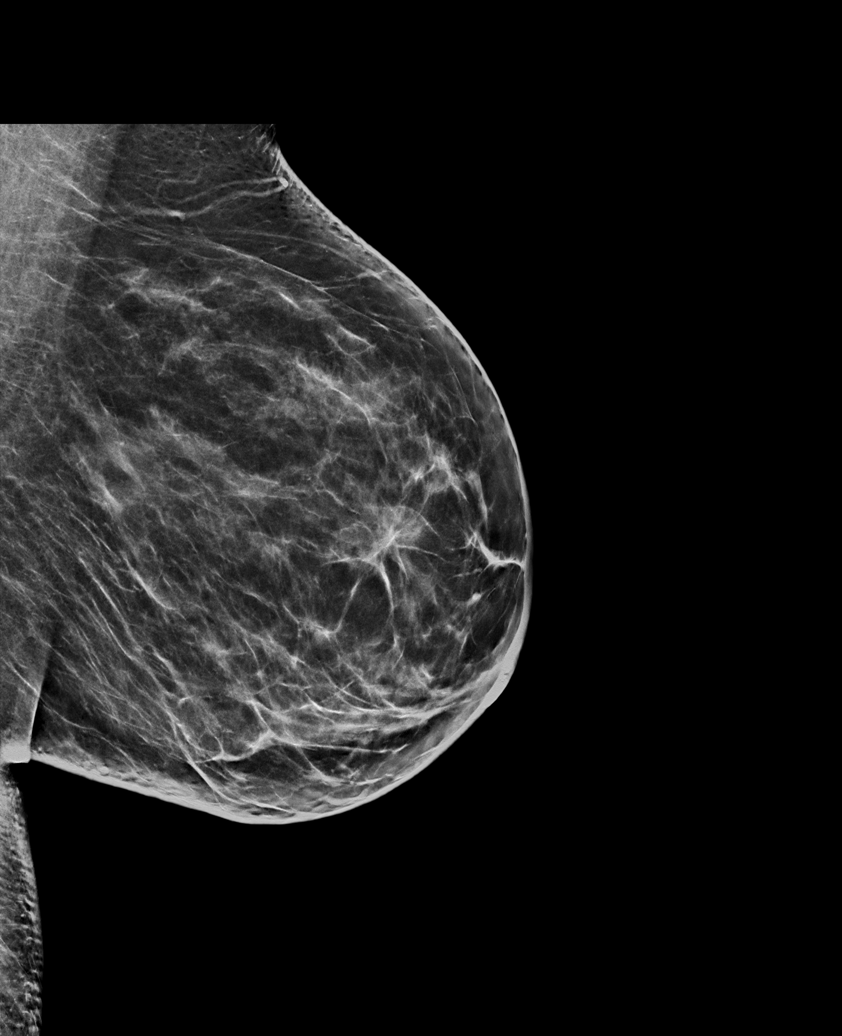

[L MLO tomo · tomo slice 38/75.0]
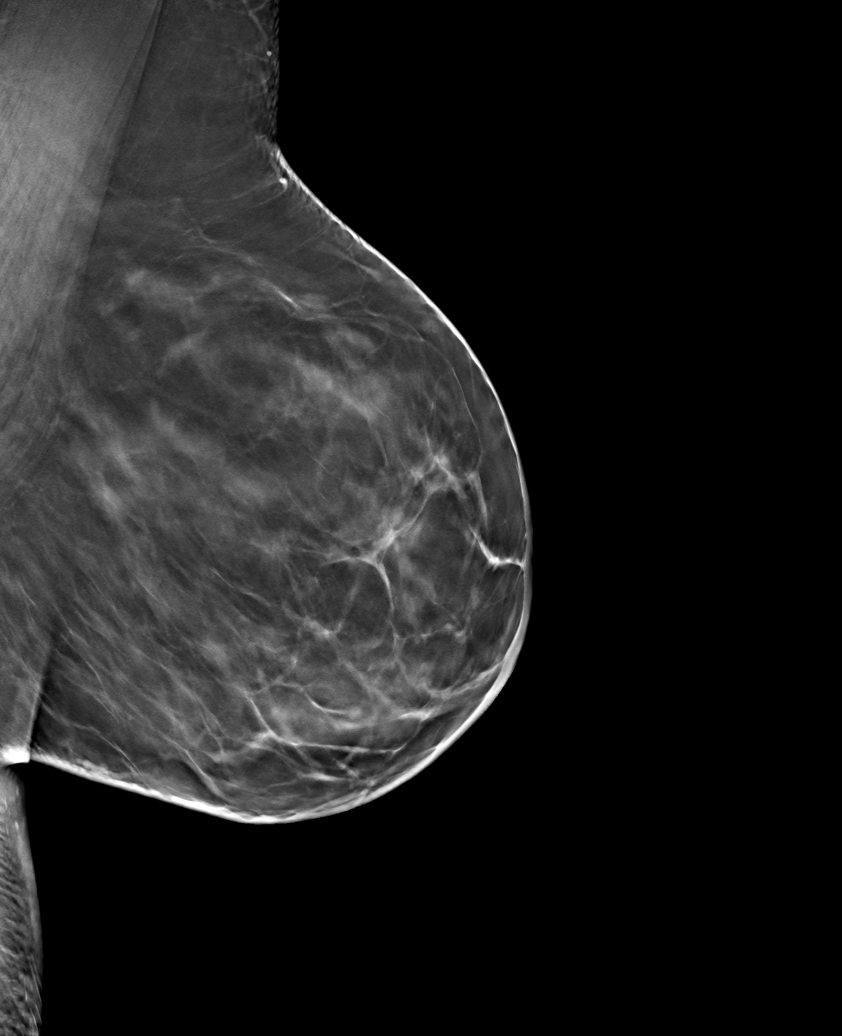

[L CC tomo · tomo slice 33/66.0]
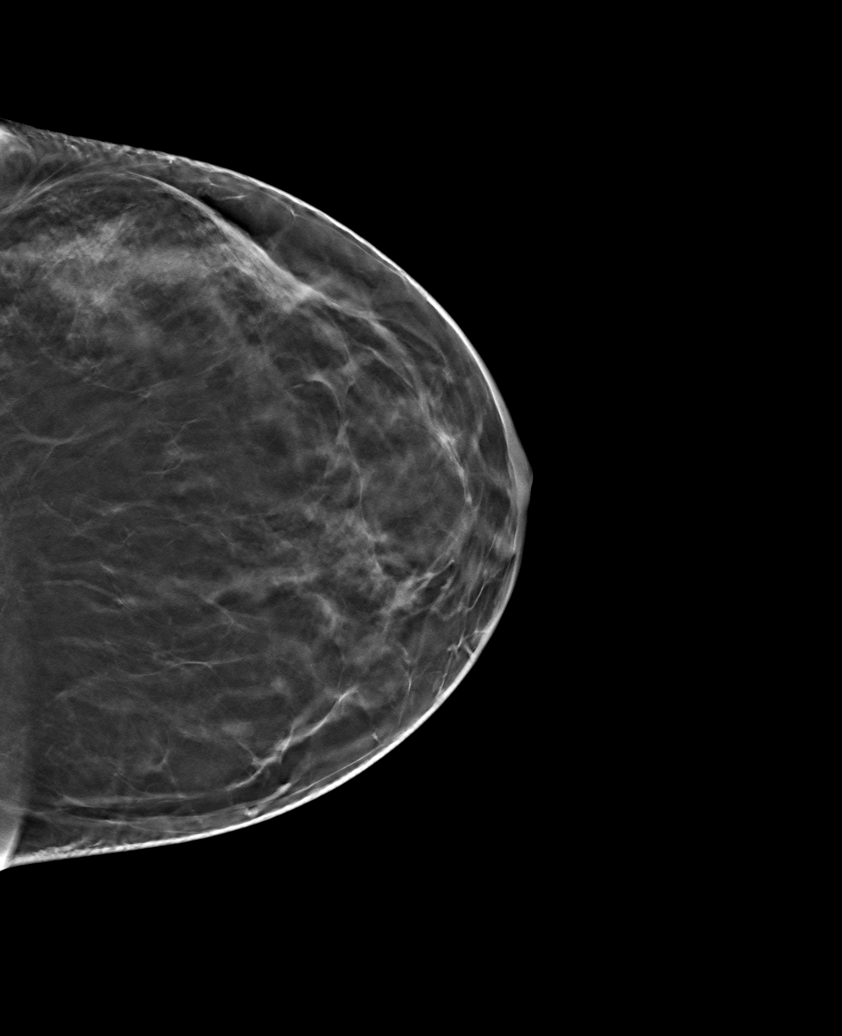

[6 of 14 positions shown; findings below may reference images not displayed]

ACR Breast Density Category c: The breast tissue is heterogeneously
dense, which may obscure small masses.
FINDINGS: Additional views of the left breast with tomosynthesis demonstrate
no persistent abnormality. The appearance noted on the screening
study is consistent with a summation shadow.

Mammographic images were processed with CAD.
IMPRESSION: No persistent abnormality on additional evaluation the left breast.

RECOMMENDATION:
Screening mammography in 1 year.

I have discussed the findings and recommendations with the patient.
Results were also provided in writing at the conclusion of the
visit. If applicable, a reminder letter will be sent to the patient
regarding the next appointment.

BI-RADS CATEGORY  1: Negative.

## 2019-03-16 ENCOUNTER — Encounter: Payer: Self-pay | Admitting: Internal Medicine

## 2019-11-12 ENCOUNTER — Emergency Department (HOSPITAL_COMMUNITY)
Admission: EM | Admit: 2019-11-12 | Discharge: 2019-11-12 | Disposition: A | Discharge status: Home / Self Care | Payer: BC Managed Care – PPO | Attending: Emergency Medicine | Admitting: Emergency Medicine

## 2019-11-12 ENCOUNTER — Other Ambulatory Visit: Payer: Self-pay

## 2019-11-12 DIAGNOSIS — R1032 Left lower quadrant pain: Secondary | ICD-10-CM | POA: Diagnosis not present

## 2019-11-12 DIAGNOSIS — Z5321 Procedure and treatment not carried out due to patient leaving prior to being seen by health care provider: Secondary | ICD-10-CM | POA: Insufficient documentation

## 2019-11-12 NOTE — ED Notes (Signed)
Pt. Was called X 3 inside the lobby and the outside.  No response from the pt. Nurses aware.

## 2019-11-12 NOTE — ED Notes (Signed)
Pt gave registration her labels and LWBS

## 2019-11-12 NOTE — ED Triage Notes (Signed)
Per patient, she developed abdominal pain two days ago. Llq. Pain is 10/10 - sharp pain. Went to  Urgent care. No blood in urine sample. Patient sent to ED for scan.

## 2020-09-24 ENCOUNTER — Encounter: Payer: Self-pay | Admitting: Gynecologic Oncology

## 2020-09-24 ENCOUNTER — Telehealth: Payer: Self-pay | Admitting: *Deleted

## 2020-09-24 NOTE — Telephone Encounter (Signed)
Called and left the patient a message to call the office back. Patient needs to be scheduled for a new patient appt  °

## 2020-09-24 NOTE — Telephone Encounter (Signed)
Patient called back and scheduled a new patient appt for 6/15. Patient given the address and phone number for the clinic. Patient also given the policy for parking, visitors and mask. Patient offered appt for tomorrow but declined due to a work meeting from 9-12

## 2020-09-25 ENCOUNTER — Ambulatory Visit: Payer: BC Managed Care – PPO | Admitting: Gynecologic Oncology

## 2020-09-26 ENCOUNTER — Inpatient Hospital Stay: Payer: BC Managed Care – PPO | Attending: Gynecologic Oncology | Admitting: Gynecologic Oncology

## 2020-09-26 ENCOUNTER — Other Ambulatory Visit: Payer: Self-pay

## 2020-09-26 ENCOUNTER — Encounter: Payer: Self-pay | Admitting: Gynecologic Oncology

## 2020-09-26 VITALS — BP 153/98 | HR 97 | Temp 97.3°F | Resp 20 | Wt 191.6 lb

## 2020-09-26 DIAGNOSIS — I10 Essential (primary) hypertension: Secondary | ICD-10-CM | POA: Insufficient documentation

## 2020-09-26 DIAGNOSIS — E119 Type 2 diabetes mellitus without complications: Secondary | ICD-10-CM | POA: Insufficient documentation

## 2020-09-26 DIAGNOSIS — Z6832 Body mass index (BMI) 32.0-32.9, adult: Secondary | ICD-10-CM | POA: Insufficient documentation

## 2020-09-26 DIAGNOSIS — E669 Obesity, unspecified: Secondary | ICD-10-CM | POA: Insufficient documentation

## 2020-09-26 DIAGNOSIS — E6609 Other obesity due to excess calories: Secondary | ICD-10-CM | POA: Insufficient documentation

## 2020-09-26 DIAGNOSIS — Z79899 Other long term (current) drug therapy: Secondary | ICD-10-CM | POA: Insufficient documentation

## 2020-09-26 DIAGNOSIS — N83201 Unspecified ovarian cyst, right side: Secondary | ICD-10-CM | POA: Insufficient documentation

## 2020-09-26 DIAGNOSIS — J309 Allergic rhinitis, unspecified: Secondary | ICD-10-CM | POA: Insufficient documentation

## 2020-09-26 DIAGNOSIS — N200 Calculus of kidney: Secondary | ICD-10-CM | POA: Insufficient documentation

## 2020-09-26 DIAGNOSIS — E1169 Type 2 diabetes mellitus with other specified complication: Secondary | ICD-10-CM | POA: Insufficient documentation

## 2020-09-26 DIAGNOSIS — N83202 Unspecified ovarian cyst, left side: Secondary | ICD-10-CM | POA: Insufficient documentation

## 2020-09-26 NOTE — Progress Notes (Signed)
GYNECOLOGIC ONCOLOGY NEW PATIENT CONSULTATION   Patient Name: Sarah Romero  Patient Age: 50 y.o. Date of Service: 09/26/2020 Referring Provider: Arvella Nigh MD  Primary Care Provider: Kathyrn Lass, MD Consulting Provider: Jeral Pinch, MD   Assessment/Plan:  Perimenopausal patient with bilateral simple appearing ovarian cyst.  I discussed in detail with the patient her ultrasound reports over the last year and a half.  While I do not have access to images, we discussed the characteristics on her ultrasound reports that I received from her gynecologist.  The cysts are described either as simple cysts or follicles, measuring anywhere from 1 to nearly 5 cm.  Her total ovarian volume has measured anywhere from 4 to 6 cm over the last year and a half without significant change.  There are no concerning features described that would be suspicious for complexity.  We also reviewed that her Ca1 25 (I only have recent value) was within normal limits.  This is reassuring although we discussed the difficulty with using Ca1 25 for diagnostic purposes given it is neither particularly sensitive nor specific.  We discussed that there are many benign processes that can cause it to be elevated in and up to 30-50% of early stage ovarian cancer this value can be normal.  The patient is asymptomatic from a pelvic standpoint.  Given her perimenopausal status, I think that the appearance of her ovaries over the last year and a half is within normal limits.  My recommendation would be deferring routine surveillance imaging until she is menopausal or until she developed any sort of pelvic symptoms.  A copy of this note was sent to the patient's referring provider.   60 minutes of total time was spent for this patient encounter, including preparation, face-to-face counseling with the patient and coordination of care, and documentation of the encounter.  Jeral Pinch, MD  Division of Gynecologic Oncology   Department of Obstetrics and Gynecology  [facility]  ___________________________________________  Chief Complaint: Chief Complaint  Patient presents with   Cysts of both ovaries    History of Present Illness:  Sarah Romero is a 50 y.o. y.o. female who is seen in consultation at the request of Dr. Radene Knee or an evaluation of bilateral simple appearing adnexal cysts.  Patient reports being followed for known ovarian cysts with ultrasounds every 3 to 4 months for at least a year.  Last summer, she would have intermittent and brief pelvic pain when she would try to exercise.  She denies any similar symptoms for at least a year.  Her menses have become irregular for the last year or 2.  She will go up to 4-6 months without bleeding.  She denies any intermenstrual spotting.  She endorses a good appetite without nausea or emesis.  She reports normal bowel and bladder function.  Patient lives in [place] with her husband and 2 children.  She works as a Pharmacist, hospital.  PAST MEDICAL HISTORY:  Past Medical History:  Diagnosis Date   Diabetes mellitus type 2 in obese ([place])    Diverticulitis    Hypertension    Hypertension    Obesity (BMI 30.0-34.9)      PAST SURGICAL HISTORY:  Past Surgical History:  Procedure Laterality Date   CESAREAN SECTION     CHOLECYSTECTOMY     SHOULDER SURGERY Left    Rotator cuff repair   TONSILLECTOMY      OB/GYN HISTORY:  OB History  Gravida Para Term Preterm AB Living  1 1  2  SAB IAB Ectopic Multiple Live Births               # Outcome Date GA Lbr Len/2nd Weight Sex Delivery Anes PTL Lv  1 Para             Obstetric Comments  Had 1 C-section delivery and adopted second child    No LMP recorded.  Age at menarche: 26 Age at menopause: N/A Hx of HRT: No Hx of STDs: Denies Last pap: 12/2018 History of abnormal pap smears: Denies GYN history: Patient had 1 pregnancy delivered by C-section.  She then tried to  achieve pregnancy again with multiple IUI's as well as IVF which were unsuccessful.  She ultimately adopted her second child.  SCREENING STUDIES:  Last mammogram: 02/2020  Last colonoscopy: 2015  MEDICATIONS: Outpatient Encounter Medications as of 09/26/2020  Medication Sig   hydrochlorothiazide (HYDRODIURIL) 25 MG tablet Take 25 mg by mouth daily.   Multiple Vitamin (MULTI-VITAMIN DAILY PO) Take 1 tablet by mouth daily.   fluticasone (FLONASE) 50 MCG/ACT nasal spray Place 1 spray into both nostrils daily. (Patient not taking: Reported on 09/24/2020)   No facility-administered encounter medications on file as of 09/26/2020.    ALLERGIES:  Allergies  Allergen Reactions   Erythromycin Anaphylaxis     FAMILY HISTORY:  Family History  Problem Relation Age of Onset   Hypertension Mother    Hyperlipidemia Mother    Hypertension Father    Alzheimer's disease Father        Early onset   Brain cancer Maternal Grandmother    Uterine cancer Maternal Great-grandmother    Colon cancer Neg Hx    Colon polyps Neg Hx    Pancreatic cancer Neg Hx    Prostate cancer Neg Hx    Ovarian cancer Neg Hx      SOCIAL HISTORY:  Social Connections: Not on file    REVIEW OF SYSTEMS:  Denies appetite changes, fevers, chills, fatigue, unexplained weight changes. Denies hearing loss, neck lumps or masses, mouth sores, ringing in ears or voice changes. Denies cough or wheezing.  Denies shortness of breath. Denies chest pain or palpitations. Denies leg swelling. Denies abdominal distention, pain, blood in stools, constipation, diarrhea, nausea, vomiting, or early satiety. Denies pain with intercourse, dysuria, frequency, hematuria or incontinence. Denies hot flashes, pelvic pain, vaginal bleeding or vaginal discharge.   Denies joint pain, back pain or muscle pain/cramps. Denies itching, rash, or wounds. Denies dizziness, headaches, numbness or seizures. Denies swollen lymph nodes or glands, denies  easy bruising or bleeding. Denies anxiety, depression, confusion, or decreased concentration.  Physical Exam:  Vital Signs for this encounter:  Blood pressure (!) 153/98, pulse 97, temperature (!) 97.3 F (36.3 C), resp. rate 20, weight 191 lb 9.6 oz (86.9 kg), SpO2 99 %. Body mass index is 32.89 kg/m. General: Alert, oriented, no acute distress.  HEENT: Normocephalic, atraumatic. Sclera anicteric.  Chest: Clear to auscultation bilaterally. No wheezes, rhonchi, or rales. Cardiovascular: Regular rate and rhythm, no murmurs, rubs, or gallops.  Abdomen: Obese. Normoactive bowel sounds. Soft, nondistended, nontender to palpation. No masses or hepatosplenomegaly appreciated. No palpable fluid wave.  Extremities: Grossly normal range of motion. Warm, well perfused. No edema bilaterally.  Skin: No rashes or lesions.  Lymphatics: No cervical, supraclavicular, or inguinal adenopathy.  GU:  Normal external female genitalia. No lesions. No discharge or bleeding.             Bladder/urethra:  No lesions or masses, well supported  bladder             Vagina: Well rugated, blood within vaginal vault consistent with current menses.             Cervix: Normal appearing, no lesions.             Uterus: Small, mobile, no parametrial involvement or nodularity.             Adnexa: Mild fullness appreciated in the posterior cul-de-sac, mobile, no nodularity.  LABORATORY AND RADIOLOGIC DATA:  Outside medical records were reviewed to synthesize the above history, along with the history and physical obtained during the visit.   Lab Results  Component Value Date   WBC 17.6 (H) 09/20/2013   HGB 12.4 09/20/2013   HCT 36.9 09/20/2013   PLT 277 09/20/2013   GLUCOSE 124 (H) 09/20/2013   ALT 23 03/29/2008   AST 22 03/29/2008   NA 140 09/20/2013   K 3.1 (L) 09/20/2013   CL 98 09/20/2013   CREATININE 0.70 09/20/2013   BUN 7 09/20/2013   CO2 28 09/20/2013   CA-125 on 6/8: 12.8  Pelvic ultrasound exam at  [facility] on 6/8: Right ovary with a 4.7 x 3.6 cm simple cyst without blood flow.  Left ovary with a 3.7 x 2.3 cm simple cyst, 3.1 x 1.9 cm simple cyst, and 2.2 x 1.2 cm simple cyst.  No blood flow noted to any of the cyst but does have blood flow to bilateral ovaries.  No free fluid noted.  Pelvic ultrasound at [facility] on 02/13/2020: Uterus measures 8 x 3.6 x 5 cm with an endometrial lining of 7.6 mm.  Right ovary measures 5.5 x 6.7 x 4.7 cm and has a 4.6 x 3.8 cm simple appearing cyst without blood flow seen.  Left ovary measures 5 x 4.5 x 4.4 cm with a 4.6 x 3.5 cm simple appearing cyst and a 1.4 cm follicle.  Pelvic ultrasound at [facility] on 08/24/2019: Uterus measures 8.2 x 4.7 x 4.7 cm.  Right ovary with a 4.1 x 3.7 cm simple appearing cyst.  Left ovary with a 3.5 x 3.2 simple appearing cyst and a 1.5 x 1 cm follicle.  Pelvic ultrasound at [facility] on 06/22/2019: Left ovary with a 4.2 x 2.8 x 3 cm simple cyst, 1.6 cm simple cyst.  Right ovary with a 3.9 x 3.5 cm simple cyst, 1.6 cm simple cyst, 9 mm simple cyst, and 1.1 cm simple cyst.  Pelvic ultrasound at [facility] on 04/20/2019: Left ovary with a 3.8 x 3.4 x 2.7 cm simple cyst as well as a 1.6 cm simple cyst.  Right ovary with a 3.7 x 3.6 x 3.5 cm simple cyst and a 1 cm simple cyst.

## 2020-09-26 NOTE — Patient Instructions (Signed)
It was a pleasure meeting you today!  I will let Dr. Radene Knee know my recommendations that we discussed.  Again, based on the reports from your last couple of ultrasounds, these are most consistent with benign cysts of the ovaries.  In somebody who is still having periods, it is not abnormal to form cysts on the ovaries.  There are not characteristics described in the ultrasound reports that are concerning for a possible cancer.  I think it would be very reasonable to stop doing routine ultrasounds until you are menopausal (you do not have a period for a year).  At that point, an ultrasound could be done to reassess any cyst on either ovary.

## 2022-09-11 ENCOUNTER — Encounter: Payer: Self-pay | Admitting: Internal Medicine

## 2022-10-14 ENCOUNTER — Encounter: Payer: Self-pay | Admitting: Internal Medicine

## 2022-10-14 ENCOUNTER — Ambulatory Visit (AMBULATORY_SURGERY_CENTER): Payer: BC Managed Care – PPO

## 2022-10-14 VITALS — Ht 64.0 in | Wt 190.0 lb

## 2022-10-14 DIAGNOSIS — Z86010 Personal history of colonic polyps: Secondary | ICD-10-CM

## 2022-10-14 MED ORDER — NA SULFATE-K SULFATE-MG SULF 17.5-3.13-1.6 GM/177ML PO SOLN: 1.0000 | Freq: Once | ORAL | 0 refills | Status: AC

## 2022-10-14 NOTE — Progress Notes (Signed)

## 2022-11-04 ENCOUNTER — Ambulatory Visit (AMBULATORY_SURGERY_CENTER): Payer: BC Managed Care – PPO | Admitting: Internal Medicine

## 2022-11-04 ENCOUNTER — Encounter: Payer: Self-pay | Admitting: Internal Medicine

## 2022-11-04 VITALS — BP 131/80 | HR 68 | Temp 99.3°F | Resp 12 | Ht 64.0 in | Wt 190.0 lb

## 2022-11-04 DIAGNOSIS — Z09 Encounter for follow-up examination after completed treatment for conditions other than malignant neoplasm: Secondary | ICD-10-CM | POA: Diagnosis present

## 2022-11-04 DIAGNOSIS — Z86010 Personal history of colonic polyps: Secondary | ICD-10-CM | POA: Diagnosis not present

## 2022-11-04 MED ORDER — SODIUM CHLORIDE 0.9 % IV SOLN: 500.0000 mL | Freq: Once | INTRAVENOUS | Status: DC

## 2022-11-04 NOTE — Op Note (Signed)
[facility] Patient Name: Sarah Romero Procedure Date: 11/04/2022 8:31 AM MRN: [id number] Endoscopist: Wilhemina Bonito. Marina Goodell , MD, [id number] Age: 52 Referring MD:  Date of Birth: 1970-07-04 Gender: Female Account #: [id number] Procedure:                Colonoscopy Indications:              High risk colon cancer surveillance: Personal                            history of sessile serrated colon polyp (less than                            10 mm in size) with no dysplasia. Previous                            examination 2015 Medicines:                Monitored Anesthesia Care Procedure:                Pre-Anesthesia Assessment:                           - Prior to the procedure, a History and Physical                            was performed, and patient medications and                            allergies were reviewed. The patient's tolerance of                            previous anesthesia was also reviewed. The risks                            and benefits of the procedure and the sedation                            options and risks were discussed with the patient.                            All questions were answered, and informed consent                            was obtained. Prior Anticoagulants: The patient has                            taken no anticoagulant or antiplatelet agents. ASA                            Grade Assessment: II - A patient with mild systemic                            disease. After reviewing the risks and benefits,  the patient was deemed in satisfactory condition to                            undergo the procedure.                           After obtaining informed consent, the colonoscope                            was passed under direct vision. Throughout the                            procedure, the patient's blood pressure, pulse, and                            oxygen saturations were monitored continuously.  The                            CF [id number] #[id number] was introduced through the anus                            and advanced to the the cecum, identified by                            appendiceal orifice and ileocecal valve. The                            ileocecal valve, appendiceal orifice, and rectum                            were photographed. The quality of the bowel                            preparation was excellent. The colonoscopy was                            performed without difficulty. The patient tolerated                            the procedure well. The bowel preparation used was                            SUPREP via split dose instruction. Scope In: 8:50:23 AM Scope Out: 9:02:29 AM Scope Withdrawal Time: 0 hours 9 minutes 46 seconds  Total Procedure Duration: 0 hours 12 minutes 6 seconds  Findings:                 Multiple diverticula were found in the entire colon.                           The exam was otherwise without abnormality on                            direct and retroflexion views. Complications:  No immediate complications. Estimated blood loss:                            None. Estimated Blood Loss:     Estimated blood loss: none. Impression:               - Diverticulosis in the entire examined colon.                           - The examination was otherwise normal on direct                            and retroflexion views.                           - No specimens collected. Recommendation:           - Repeat colonoscopy in 10 years for surveillance.                           - Patient has a contact number available for                            emergencies. The signs and symptoms of potential                            delayed complications were discussed with the                            patient. Return to normal activities tomorrow.                            Written discharge instructions were provided to the                             patient.                           - Resume previous diet.                           - Continue present medications. Wilhemina Bonito. Marina Goodell, MD 11/04/2022 9:07:31 AM This report has been signed electronically.

## 2022-11-04 NOTE — Progress Notes (Signed)
Pt's states no medical or surgical changes since previsit or office visit. VS assessed by D.T 

## 2022-11-04 NOTE — Patient Instructions (Signed)
YOU HAD AN ENDOSCOPIC PROCEDURE TODAY AT THE [facility]:   Refer to the procedure report that was given to you for any specific questions about what was found during the examination.  If the procedure report does not answer your questions, please call your gastroenterologist to clarify.  If you requested that your care partner not be given the details of your procedure findings, then the procedure report has been included in a sealed envelope for you to review at your convenience later.  YOU SHOULD EXPECT: Some feelings of bloating in the abdomen. Passage of more gas than usual.  Walking can help get rid of the air that was put into your GI tract during the procedure and reduce the bloating. If you had a lower endoscopy (such as a colonoscopy or flexible sigmoidoscopy) you may notice spotting of blood in your stool or on the toilet paper. If you underwent a bowel prep for your procedure, you may not have a normal bowel movement for a few days.  Please Note:  You might notice some irritation and congestion in your nose or some drainage.  This is from the oxygen used during your procedure.  There is no need for concern and it should clear up in a day or so.  SYMPTOMS TO REPORT IMMEDIATELY:  Following lower endoscopy (colonoscopy or flexible sigmoidoscopy):  Excessive amounts of blood in the stool  Significant tenderness or worsening of abdominal pains  Swelling of the abdomen that is new, acute  Fever of 100F or higher  For urgent or emergent issues, a gastroenterologist can be reached at any hour by calling [phone]. Do not use MyChart messaging for urgent concerns.    DIET:  We do recommend a small meal at first, but then you may proceed to your regular diet.  Drink plenty of fluids but you should avoid alcoholic beverages for 24 hours.  ACTIVITY:  You should plan to take it easy for the rest of today and you should NOT DRIVE or use heavy machinery until tomorrow (because of  the sedation medicines used during the test).    FOLLOW UP: Our staff will call the number listed on your records the next business day following your procedure.  We will call around 7:15- 8:00 am to check on you and address any questions or concerns that you may have regarding the information given to you following your procedure. If we do not reach you, we will leave a message.      SIGNATURES/CONFIDENTIALITY: You and/or your care partner have signed paperwork which will be entered into your electronic medical record.  These signatures attest to the fact that that the information above on your After Visit Summary has been reviewed and is understood.  Full responsibility of the confidentiality of this discharge information lies with you and/or your care-partner. 

## 2022-11-04 NOTE — Progress Notes (Signed)
HISTORY OF PRESENT ILLNESS:  Sarah Romero is a 52 y.o. female who presents today for surveillance colonoscopy.  No complaints  REVIEW OF SYSTEMS:  All non-GI ROS negative except for  Past Medical History:  Diagnosis Date   Allergy    Diabetes mellitus type 2 in obese    Diverticulitis    Hypertension    Hypertension    Obesity (BMI 30.0-34.9)     Past Surgical History:  Procedure Laterality Date   CESAREAN SECTION     CHOLECYSTECTOMY     SHOULDER SURGERY Left    Rotator cuff repair   TONSILLECTOMY      Social History Sarah Romero  reports that she has never smoked. She has never used smokeless tobacco. She reports current alcohol use. She reports that she does not use drugs.  family history includes Alzheimer's disease in her father; Brain cancer in her maternal grandmother; Hyperlipidemia in her mother; Hypertension in her father and mother; Uterine cancer in her maternal great-grandmother.  Allergies  Allergen Reactions   Erythromycin Anaphylaxis       PHYSICAL EXAMINATION: Vital signs: BP 133/82   Pulse 86   Temp 99.3 F (37.4 C) (Skin)   Ht 5\' 4"  (1.626 m)   Wt 190 lb (86.2 kg)   SpO2 96%   BMI 32.61 kg/m  General: Well-developed, well-nourished, no acute distress HEENT: Sclerae are anicteric, conjunctiva pink. Oral mucosa intact Lungs: Clear Heart: Regular Abdomen: soft, nontender, nondistended, no obvious ascites, no peritoneal signs, normal bowel sounds. No organomegaly. Extremities: No edema Psychiatric: alert and oriented x3. Cooperative     ASSESSMENT:  History of sessile serrated polyps   PLAN:  Surveillance colonoscopy

## 2022-11-04 NOTE — Progress Notes (Signed)
A and O x3. Report to RN. Tolerated MAC anesthesia well.

## 2022-11-05 ENCOUNTER — Telehealth: Payer: Self-pay

## 2022-11-05 NOTE — Telephone Encounter (Signed)
  Follow up Call-     11/04/2022    8:07 AM  Call back number  Post procedure Call Back phone  # [phone]  Permission to leave phone message Yes     Patient questions:  Do you have a fever, pain , or abdominal swelling? No. Pain Score  0 *  Have you tolerated food without any problems? Yes.    Have you been able to return to your normal activities? Yes.    Do you have any questions about your discharge instructions: Diet   No. Medications  No. Follow up visit  No.  Do you have questions or concerns about your Care? No.  Actions: * If pain score is 4 or above: No action needed, pain <4.
